# Patient Record
Sex: Female | Born: 1975 | Race: White | Hispanic: No | Marital: Married | Smoking: Never smoker
Health system: Southern US, Community
[De-identification: ages and names within clinical notes are randomized; demographics above are authoritative.]

## PROBLEM LIST (undated history)

## (undated) DIAGNOSIS — F419 Anxiety disorder, unspecified: Secondary | ICD-10-CM

## (undated) HISTORY — PX: WISDOM TOOTH EXTRACTION: SHX21

## (undated) HISTORY — PX: TONSILLECTOMY: SUR1361

---

## 2015-12-09 ENCOUNTER — Encounter (HOSPITAL_COMMUNITY): Payer: Self-pay | Admitting: *Deleted

## 2016-01-28 ENCOUNTER — Encounter (HOSPITAL_COMMUNITY): Payer: Self-pay

## 2016-01-28 ENCOUNTER — Encounter (HOSPITAL_COMMUNITY)
Admission: RE | Disposition: A | Discharge status: Home / Self Care | Payer: Self-pay | Source: Ambulatory Visit | Attending: Obstetrics and Gynecology

## 2016-01-28 ENCOUNTER — Ambulatory Visit (HOSPITAL_COMMUNITY): Payer: BLUE CROSS/BLUE SHIELD | Admitting: Anesthesiology

## 2016-01-28 ENCOUNTER — Ambulatory Visit (HOSPITAL_COMMUNITY)
Admission: RE | Admit: 2016-01-28 | Discharge: 2016-01-28 | Disposition: A | Discharge status: Home / Self Care | Payer: BLUE CROSS/BLUE SHIELD | Source: Ambulatory Visit | Attending: Obstetrics and Gynecology | Admitting: Obstetrics and Gynecology

## 2016-01-28 ENCOUNTER — Other Ambulatory Visit: Payer: Self-pay | Admitting: Obstetrics and Gynecology

## 2016-01-28 DIAGNOSIS — N840 Polyp of corpus uteri: Secondary | ICD-10-CM | POA: Insufficient documentation

## 2016-01-28 DIAGNOSIS — E669 Obesity, unspecified: Secondary | ICD-10-CM | POA: Insufficient documentation

## 2016-01-28 DIAGNOSIS — N920 Excessive and frequent menstruation with regular cycle: Secondary | ICD-10-CM | POA: Diagnosis not present

## 2016-01-28 DIAGNOSIS — Z6832 Body mass index (BMI) 32.0-32.9, adult: Secondary | ICD-10-CM | POA: Insufficient documentation

## 2016-01-28 HISTORY — PX: DILITATION & CURRETTAGE/HYSTROSCOPY WITH NOVASURE ABLATION: SHX5568

## 2016-01-28 HISTORY — DX: Anxiety disorder, unspecified: F41.9

## 2016-01-28 LAB — TYPE AND SCREEN
ABO/RH(D): A NEG
Antibody Screen: NEGATIVE

## 2016-01-28 LAB — CBC
HCT: 39 % (ref 36.0–46.0)
HEMOGLOBIN: 13.9 g/dL (ref 12.0–15.0)
MCH: 32.6 pg (ref 26.0–34.0)
MCHC: 35.6 g/dL (ref 30.0–36.0)
MCV: 91.3 fL (ref 78.0–100.0)
PLATELETS: 234 10*3/uL (ref 150–400)
RBC: 4.27 MIL/uL (ref 3.87–5.11)
RDW: 12.5 % (ref 11.5–15.5)
WBC: 5.4 10*3/uL (ref 4.0–10.5)

## 2016-01-28 LAB — PREGNANCY, URINE: PREG TEST UR: NEGATIVE

## 2016-01-28 LAB — ABO/RH: ABO/RH(D): A NEG

## 2016-01-28 SURGERY — DILATATION & CURETTAGE/HYSTEROSCOPY WITH NOVASURE ABLATION
Anesthesia: General

## 2016-01-28 MED ORDER — LACTATED RINGERS IV SOLN
INTRAVENOUS | Status: DC
  Administered 2016-01-28 (×2): via INTRAVENOUS

## 2016-01-28 MED ORDER — IBUPROFEN 800 MG PO TABS: 800.0000 mg | ORAL_TABLET | Freq: Three times a day (TID) | ORAL | 0 refills | Status: AC | PRN

## 2016-01-28 MED ORDER — LACTATED RINGERS IV SOLN: INTRAVENOUS | Status: DC

## 2016-01-28 MED ORDER — SCOPOLAMINE 1 MG/3DAYS TD PT72
MEDICATED_PATCH | TRANSDERMAL | Status: AC
  Administered 2016-01-28: 1.5 mg via TRANSDERMAL
  Filled 2016-01-28: qty 1

## 2016-01-28 MED ORDER — LACTATED RINGERS IR SOLN
Status: DC | PRN
  Administered 2016-01-28: 3000 mL

## 2016-01-28 MED ORDER — DEXAMETHASONE SODIUM PHOSPHATE 10 MG/ML IJ SOLN
INTRAMUSCULAR | Status: DC | PRN
  Administered 2016-01-28: 4 mg via INTRAVENOUS

## 2016-01-28 MED ORDER — LIDOCAINE HCL (CARDIAC) 20 MG/ML IV SOLN
INTRAVENOUS | Status: DC | PRN
  Administered 2016-01-28: 60 mg via INTRAVENOUS

## 2016-01-28 MED ORDER — LIDOCAINE HCL 1 % IJ SOLN
INTRAMUSCULAR | Status: AC
  Filled 2016-01-28: qty 20

## 2016-01-28 MED ORDER — LIDOCAINE HCL 1 % IJ SOLN
INTRAMUSCULAR | Status: DC | PRN
  Administered 2016-01-28: 10 mL

## 2016-01-28 MED ORDER — MIDAZOLAM HCL 2 MG/2ML IJ SOLN
INTRAMUSCULAR | Status: DC | PRN
  Administered 2016-01-28: 2 mg via INTRAVENOUS

## 2016-01-28 MED ORDER — FENTANYL CITRATE (PF) 100 MCG/2ML IJ SOLN
INTRAMUSCULAR | Status: DC | PRN
  Administered 2016-01-28: 100 ug via INTRAVENOUS

## 2016-01-28 MED ORDER — ONDANSETRON HCL 4 MG/2ML IJ SOLN
INTRAMUSCULAR | Status: DC | PRN
  Administered 2016-01-28: 4 mg via INTRAVENOUS

## 2016-01-28 MED ORDER — SCOPOLAMINE 1 MG/3DAYS TD PT72
1.0000 | MEDICATED_PATCH | Freq: Once | TRANSDERMAL | Status: DC
  Administered 2016-01-28: 1.5 mg via TRANSDERMAL

## 2016-01-28 MED ORDER — GLYCINE 1.5 % IR SOLN: Status: DC | PRN

## 2016-01-28 MED ORDER — ONDANSETRON 8 MG PO TBDP: 8.0000 mg | ORAL_TABLET | Freq: Three times a day (TID) | ORAL | 0 refills | Status: AC | PRN

## 2016-01-28 MED ORDER — ONDANSETRON HCL 4 MG/2ML IJ SOLN: 4.0000 mg | Freq: Once | INTRAMUSCULAR | Status: DC | PRN

## 2016-01-28 MED ORDER — HYDROCODONE-ACETAMINOPHEN 5-325 MG PO TABS: 1.0000 | ORAL_TABLET | Freq: Four times a day (QID) | ORAL | 0 refills | Status: AC | PRN

## 2016-01-28 MED ORDER — FENTANYL CITRATE (PF) 100 MCG/2ML IJ SOLN
INTRAMUSCULAR | Status: AC
Start: 2016-01-28 — End: 2016-01-28
  Filled 2016-01-28: qty 2

## 2016-01-28 MED ORDER — GLYCOPYRROLATE 0.2 MG/ML IJ SOLN
INTRAMUSCULAR | Status: DC | PRN
  Administered 2016-01-28: 0.2 mg via INTRAVENOUS

## 2016-01-28 MED ORDER — MIDAZOLAM HCL 2 MG/2ML IJ SOLN
INTRAMUSCULAR | Status: AC
  Filled 2016-01-28: qty 2

## 2016-01-28 MED ORDER — PROPOFOL 10 MG/ML IV BOLUS
INTRAVENOUS | Status: DC | PRN
  Administered 2016-01-28: 200 mg via INTRAVENOUS

## 2016-01-28 MED ORDER — KETOROLAC TROMETHAMINE 30 MG/ML IJ SOLN
INTRAMUSCULAR | Status: DC | PRN
  Administered 2016-01-28: 30 mg via INTRAVENOUS

## 2016-01-28 MED ORDER — FENTANYL CITRATE (PF) 100 MCG/2ML IJ SOLN: 25.0000 ug | INTRAMUSCULAR | Status: DC | PRN

## 2016-01-28 SURGICAL SUPPLY — 18 items
ABLATOR ENDOMETRIAL BIPOLAR (ABLATOR) ×3 IMPLANT
CANISTER SUCT 3000ML (MISCELLANEOUS) ×3 IMPLANT
CATH ROBINSON RED A/P 16FR (CATHETERS) ×3 IMPLANT
CLOTH BEACON ORANGE TIMEOUT ST (SAFETY) ×3 IMPLANT
CONTAINER PREFILL 10% NBF 60ML (FORM) ×6 IMPLANT
DILATOR CANAL MILEX (MISCELLANEOUS) IMPLANT
ELECT REM PT RETURN 9FT ADLT (ELECTROSURGICAL)
ELECTRODE REM PT RTRN 9FT ADLT (ELECTROSURGICAL) IMPLANT
GLOVE BIO SURGEON STRL SZ7 (GLOVE) ×3 IMPLANT
GLOVE BIOGEL PI IND STRL 7.0 (GLOVE) ×1 IMPLANT
GLOVE BIOGEL PI INDICATOR 7.0 (GLOVE) ×2
GOWN STRL REUS W/TWL LRG LVL3 (GOWN DISPOSABLE) ×9 IMPLANT
PACK VAGINAL MINOR WOMEN LF (CUSTOM PROCEDURE TRAY) ×3 IMPLANT
PAD OB MATERNITY 4.3X12.25 (PERSONAL CARE ITEMS) ×3 IMPLANT
TOWEL OR 17X24 6PK STRL BLUE (TOWEL DISPOSABLE) ×6 IMPLANT
TUBING AQUILEX INFLOW (TUBING) ×3 IMPLANT
TUBING AQUILEX OUTFLOW (TUBING) ×3 IMPLANT
WATER STERILE IRR 1000ML POUR (IV SOLUTION) ×3 IMPLANT

## 2016-01-28 NOTE — Op Note (Signed)
Pre-Operative Diagnosis: 1) menorrhagia Postoperative Diagnosis: 1) menorrhagia Procedure: Hysteroscopy, dilation and curettage, novasure endometrial ablation Surgeon: Dr. Waynard ReedsKendra Anjelita Sheahan Assistant: None Operative Findings: Retroverted uterus, 8 week size. Uterus sounded to 9 cm. Cavity width 2.6 cm, cervical length 4.5 cm. Ablation power 67 watts. Ablation time 2:00. Intrauterine cavity with multiple endometrial polyps noted on hysteroscopy. Bilateral tubal ostia noted following curettage. Following the endometrial ablation procedure a repeat hysteroscopy was performed. Adequate endometrial ablation was noted. Fluid deficit 500 cc Specimen: Endometrial curettings EBL: minimal  Ms. Tracey Boyer Is a 40 year old who presents for definitive surgical management for menorrhagia. Please see the patient's history and physical for complete details of the history. Management options were discussed with the patient. R/B/A reviewed. Following appropriate informed consent was taken to the operating room. The patient was appropriately identified during a time out procedure. General anesthesia was administered and the patient was placed in the dorsal lithotomy position. The patient was prepped and draped in the normal sterile fashion. A speculum was placed into the vagina, a single-tooth tenaculum was placed on the anterior lip of the cervix, and 10 cc of 1% lidocaine was administered in a paracervical fashion. The cervix was serially dilated with Hank dilators. The uterus sounded to 9cm cm. hysteroscopy was performed for the above findings. The hysteroscope was removed and the NovaSure endometrial ablation device was deployed. The cavity assessment was performed and passed successfully on the first try. The ablation was initiated and the procedure was performed for 2 minutes. The NovaSure device was then removed and repeat hysteroscopy was performed for the above findings. The hysteroscope was removed, and the single-tooth  tenaculum was also removed from the anterior lip of the cervix. The tenaculum site was noted to be hemostatic. The speculum was removed from the vagina and this completed the surgical procedure. The patient was awakened in the operating room and transferred to the PACU in stable condition following the procedure. All sponge, lap, needle counts were correct

## 2016-01-28 NOTE — Anesthesia Procedure Notes (Signed)
Procedure Name: LMA Insertion Date/Time: 01/28/2016 12:02 PM Performed by: Shanon PayorGREGORY, Sharmon Cheramie M Pre-anesthesia Checklist: Patient identified, Emergency Drugs available, Suction available, Timeout performed and Patient being monitored Patient Re-evaluated:Patient Re-evaluated prior to inductionOxygen Delivery Method: Circle system utilized Preoxygenation: Pre-oxygenation with 100% oxygen Intubation Type: IV induction LMA: LMA inserted LMA Size: 4.0 Number of attempts: 1 Placement Confirmation: positive ETCO2 and breath sounds checked- equal and bilateral Tube secured with: Tape Dental Injury: Teeth and Oropharynx as per pre-operative assessment

## 2016-01-28 NOTE — Anesthesia Preprocedure Evaluation (Signed)
Anesthesia Evaluation  Patient identified by MRN, date of birth, ID band Patient awake    Reviewed: Allergy & Precautions, H&P , NPO status , Patient's Chart, lab work & pertinent test results  History of Anesthesia Complications Negative for: history of anesthetic complications  Airway Mallampati: II  TM Distance: >3 FB Neck ROM: full    Dental no notable dental hx.    Pulmonary neg pulmonary ROS,    Pulmonary exam normal breath sounds clear to auscultation       Cardiovascular negative cardio ROS Normal cardiovascular exam Rhythm:regular Rate:Normal     Neuro/Psych Anxiety negative neurological ROS     GI/Hepatic negative GI ROS, Neg liver ROS,   Endo/Other  obesity  Renal/GU negative Renal ROS     Musculoskeletal   Abdominal   Peds  Hematology negative hematology ROS (+)   Anesthesia Other Findings   Reproductive/Obstetrics negative OB ROS                             Anesthesia Physical Anesthesia Plan  ASA: II  Anesthesia Plan: General   Post-op Pain Management:    Induction: Intravenous  Airway Management Planned: LMA  Additional Equipment:   Intra-op Plan:   Post-operative Plan:   Informed Consent: I have reviewed the patients History and Physical, chart, labs and discussed the procedure including the risks, benefits and alternatives for the proposed anesthesia with the patient or authorized representative who has indicated his/her understanding and acceptance.   Dental Advisory Given  Plan Discussed with: Anesthesiologist, CRNA and Surgeon  Anesthesia Plan Comments:         Anesthesia Quick Evaluation

## 2016-01-28 NOTE — Brief Op Note (Signed)
01/28/2016  12:47 PM  PATIENT:  Tracey CulverStacy Boyer  40 y.o. female  PRE-OPERATIVE DIAGNOSIS:  MENORRHAGIA  POST-OPERATIVE DIAGNOSIS:  * No post-op diagnosis entered *  PROCEDURE:  Procedure(s): DILATATION & CURETTAGE/HYSTEROSCOPY WITH NOVASURE ABLATION (N/A)  SURGEON:  Surgeon(s) and Role:    * Waynard ReedsKendra Vedansh Kerstetter, MD - Primary  PHYSICIAN ASSISTANT:   ASSISTANTS: none   ANESTHESIA:   general  EBL:  Total I/O In: 1000 [I.V.:1000] Out: 60 [Urine:50; Blood:10]  BLOOD ADMINISTERED:none  DRAINS: none   LOCAL MEDICATIONS USED:  LIDOCAINE   SPECIMEN:  Source of Specimen:  endometrial curettings  DISPOSITION OF SPECIMEN:  PATHOLOGY  COUNTS:  YES  TOURNIQUET:  * No tourniquets in log *  DICTATION: .Dragon Dictation  PLAN OF CARE: Discharge to home after PACU  PATIENT DISPOSITION:  PACU - hemodynamically stable.   Delay start of Pharmacological VTE agent (>24hrs) due to surgical blood loss or risk of bleeding: yes

## 2016-01-28 NOTE — Discharge Instructions (Addendum)
DISCHARGE INSTRUCTIONS: HYSTEROSCOPY / ENDOMETRIAL ABLATION The following instructions have been prepared to help you care for yourself upon your return home.  May Remove Scop patch on or before: Monday September 25,2017  May take Ibuprofen after: 6:30 pm today  May take stool softner while taking narcotic pain medication to prevent constipation.  Drink plenty of water.  Personal hygiene:  Use sanitary pads for vaginal drainage, not tampons.  Shower the day after your procedure.  NO tub baths, pools or Jacuzzis for 2-3 weeks.  Wipe front to back after using the bathroom.  Activity and limitations:  Do NOT drive or operate any equipment for 24 hours. The effects of anesthesia are still present and drowsiness may result.  Do NOT rest in bed all day.  Walking is encouraged.  Walk up and down stairs slowly.  You may resume your normal activity in one to two days or as indicated by your physician. Sexual activity: NO intercourse for at least 2 weeks after the procedure, or as indicated by your Doctor.  Diet: Eat a light meal as desired this evening. You may resume your usual diet tomorrow.  Return to Work: You may resume your work activities in one to two days or as indicated by Therapist, sportsyour Doctor.  What to expect after your surgery: Expect to have vaginal bleeding/discharge for 2-3 days and spotting for up to 10 days. It is not unusual to have soreness for up to 1-2 weeks. You may have a slight burning sensation when you urinate for the first day. Mild cramps may continue for a couple of days. You may have a regular period in 2-6 weeks.  Call your doctor for any of the following:  Excessive vaginal bleeding or clotting, saturating and changing one pad every hour.  Inability to urinate 6 hours after discharge from hospital.  Pain not relieved by pain medication.  Fever of 100.4 F or greater.  Unusual vaginal discharge or odor.  Return to office _________________Call for  an appointment ___________________ Patients signature: ______________________ Nurses signature ________________________  Post Anesthesia Care Unit [phone] Post Anesthesia Home Care Instructions  Activity: Get plenty of rest for the remainder of the day. A responsible adult should stay with you for 24 hours following the procedure.  For the next 24 hours, DO NOT: -Drive a car -Advertising copywriterperate machinery -Drink alcoholic beverages -Take any medication unless instructed by your physician -Make any legal decisions or sign important papers.  Meals: Start with liquid foods such as gelatin or soup. Progress to regular foods as tolerated. Avoid greasy, spicy, heavy foods. If nausea and/or vomiting occur, drink only clear liquids until the nausea and/or vomiting subsides. Call your physician if vomiting continues.  Special Instructions/Symptoms: Your throat may feel dry or sore from the anesthesia or the breathing tube placed in your throat during surgery. If this causes discomfort, gargle with warm salt water. The discomfort should disappear within 24 hours.  If you had a scopolamine patch placed behind your ear for the management of post- operative nausea and/or vomiting:  1. The medication in the patch is effective for 72 hours, after which it should be removed.  Wrap patch in a tissue and discard in the trash. Wash hands thoroughly with soap and water. 2. You may remove the patch earlier than 72 hours if you experience unpleasant side effects which may include dry mouth, dizziness or visual disturbances. 3. Avoid touching the patch. Wash your hands with soap and water after contact with the patch.

## 2016-01-28 NOTE — Transfer of Care (Signed)
Immediate Anesthesia Transfer of Care Note  Patient: Montel CulverStacy Helfand  Procedure(s) Performed: Procedure(s): DILATATION & CURETTAGE/HYSTEROSCOPY WITH NOVASURE ABLATION (N/A)  Patient Location: PACU  Anesthesia Type:General  Level of Consciousness: awake, alert  and oriented  Airway & Oxygen Therapy: Patient Spontanous Breathing and Patient connected to nasal cannula oxygen  Post-op Assessment: Report given to RN and Post -op Vital signs reviewed and stable  Post vital signs: Reviewed and stable  Last Vitals:  Vitals:   01/28/16 1043  BP: 122/80  Pulse: 61  Resp: 16  Temp: 36.8 C    Last Pain:  Vitals:   01/28/16 1043  TempSrc: Oral      Patients Stated Pain Goal: 3 (01/28/16 1043)  Complications: No apparent anesthesia complications

## 2016-01-28 NOTE — Anesthesia Postprocedure Evaluation (Signed)
Anesthesia Post Note  Patient: Nikeya Pamintuan  Procedure(s) Performed: Procedure(s) (LRB): DILATATION & CURETTAGE/HYSTEROSCOPY WITH NOVASURE ABLATION (N/A)  Patient location during evaluation: PACU Anesthesia Type: General Level of consciousness: awake and alert Pain management: pain level controlled Vital Signs Assessment: post-procedure vital signs reviewed and stable Respiratory status: spontaneous breathing, nonlabored ventilation, respiratory function stable and patient connected to nasal cannula oxygen Cardiovascular status: blood pressure returned to baseline and stable Postop Assessment: no signs of nausea or vomiting Anesthetic complications: no     Last Vitals:  Vitals:   01/28/16 1043 01/28/16 1256  BP: 122/80 117/69  Pulse: 61 86  Resp: 16 14  Temp: 36.8 C 36.8 C    Last Pain:  Vitals:   01/28/16 1256  TempSrc:   PainSc: 1    Pain Goal: Patients Stated Pain Goal: 3 (01/28/16 1256)               Reino KentJudd, Coulter Oldaker J

## 2016-01-28 NOTE — H&P (Signed)
Tracey CulverStacy Boyer is an 40 y.o. female.   Patient presents for hysteroscopy, dilation and curettage, and endometrial ablation. The patient has a history of heavy menstrual periods and has been poorly tolerant of medical management in the past. Office ultrasound revealed an anatomically normal uterus and ovaries. Endometrial biopsy was benign. R/B./A reviewed with the patient and she wishes to proceed.   Patient's last menstrual period was 11/20/2015 (approximate).    Past Medical History:  Diagnosis Date  . Anxiety   . SVD (spontaneous vaginal delivery)    x 3    Past Surgical History:  Procedure Laterality Date  . CESAREAN SECTION     x 1  . TONSILLECTOMY    . WISDOM TOOTH EXTRACTION      No family history on file.  Social History:  reports that she has never smoked. She has never used smokeless tobacco. She reports that she drinks about 3.0 oz of alcohol per week . She reports that she does not use drugs.  Allergies: No Known Allergies   (Not in a hospital admission)  ROS  Last menstrual period 11/20/2015. Physical Exam   AOX3, NAD ABD SOFT, NT Normal work of breathing  No results found for this or any previous visit (from the past 24 hour(s)).  No results found.  Assessment/Plan: 1) Admit 2) consent 3) proceed with above procedure  Tracey Boyer H. 01/28/2016, 8:39 AM

## 2016-01-30 ENCOUNTER — Encounter (HOSPITAL_COMMUNITY): Payer: Self-pay | Admitting: Obstetrics and Gynecology

## 2016-02-29 DIAGNOSIS — E559 Vitamin D deficiency, unspecified: Secondary | ICD-10-CM | POA: Diagnosis not present

## 2016-02-29 DIAGNOSIS — Z0000 Encounter for general adult medical examination without abnormal findings: Secondary | ICD-10-CM | POA: Diagnosis not present

## 2016-02-29 DIAGNOSIS — F419 Anxiety disorder, unspecified: Secondary | ICD-10-CM | POA: Diagnosis not present

## 2016-02-29 DIAGNOSIS — F411 Generalized anxiety disorder: Secondary | ICD-10-CM | POA: Diagnosis not present

## 2016-02-29 DIAGNOSIS — Z13220 Encounter for screening for lipoid disorders: Secondary | ICD-10-CM | POA: Diagnosis not present

## 2016-10-30 DIAGNOSIS — E669 Obesity, unspecified: Secondary | ICD-10-CM | POA: Diagnosis not present

## 2016-10-30 DIAGNOSIS — F419 Anxiety disorder, unspecified: Secondary | ICD-10-CM | POA: Diagnosis not present

## 2016-10-30 DIAGNOSIS — E559 Vitamin D deficiency, unspecified: Secondary | ICD-10-CM | POA: Diagnosis not present

## 2016-10-30 DIAGNOSIS — Z6834 Body mass index (BMI) 34.0-34.9, adult: Secondary | ICD-10-CM | POA: Diagnosis not present

## 2017-01-31 DIAGNOSIS — Z713 Dietary counseling and surveillance: Secondary | ICD-10-CM | POA: Diagnosis not present

## 2017-02-27 DIAGNOSIS — Z01419 Encounter for gynecological examination (general) (routine) without abnormal findings: Secondary | ICD-10-CM | POA: Diagnosis not present

## 2017-02-27 DIAGNOSIS — Z6833 Body mass index (BMI) 33.0-33.9, adult: Secondary | ICD-10-CM | POA: Diagnosis not present

## 2017-02-27 DIAGNOSIS — Z124 Encounter for screening for malignant neoplasm of cervix: Secondary | ICD-10-CM | POA: Diagnosis not present

## 2017-03-14 DIAGNOSIS — Z713 Dietary counseling and surveillance: Secondary | ICD-10-CM | POA: Diagnosis not present

## 2017-04-13 DIAGNOSIS — Z713 Dietary counseling and surveillance: Secondary | ICD-10-CM | POA: Diagnosis not present

## 2017-05-11 DIAGNOSIS — E669 Obesity, unspecified: Secondary | ICD-10-CM | POA: Diagnosis not present

## 2017-05-11 DIAGNOSIS — F419 Anxiety disorder, unspecified: Secondary | ICD-10-CM | POA: Diagnosis not present

## 2017-06-01 DIAGNOSIS — Z6833 Body mass index (BMI) 33.0-33.9, adult: Secondary | ICD-10-CM | POA: Diagnosis not present

## 2017-06-01 DIAGNOSIS — Z1231 Encounter for screening mammogram for malignant neoplasm of breast: Secondary | ICD-10-CM | POA: Diagnosis not present

## 2017-06-04 ENCOUNTER — Other Ambulatory Visit: Payer: Self-pay | Admitting: Obstetrics and Gynecology

## 2017-06-04 DIAGNOSIS — R928 Other abnormal and inconclusive findings on diagnostic imaging of breast: Secondary | ICD-10-CM

## 2017-06-08 ENCOUNTER — Ambulatory Visit
Admission: RE | Admit: 2017-06-08 | Discharge: 2017-06-08 | Disposition: A | Discharge status: Home / Self Care | Payer: BLUE CROSS/BLUE SHIELD | Source: Ambulatory Visit | Attending: Obstetrics and Gynecology | Admitting: Obstetrics and Gynecology

## 2017-06-08 DIAGNOSIS — R922 Inconclusive mammogram: Secondary | ICD-10-CM | POA: Diagnosis not present

## 2017-06-08 DIAGNOSIS — R928 Other abnormal and inconclusive findings on diagnostic imaging of breast: Secondary | ICD-10-CM

## 2017-06-08 DIAGNOSIS — N6310 Unspecified lump in the right breast, unspecified quadrant: Secondary | ICD-10-CM | POA: Diagnosis not present

## 2017-06-08 DIAGNOSIS — N6320 Unspecified lump in the left breast, unspecified quadrant: Secondary | ICD-10-CM | POA: Diagnosis not present

## 2017-06-20 DIAGNOSIS — Z713 Dietary counseling and surveillance: Secondary | ICD-10-CM | POA: Diagnosis not present

## 2017-12-24 DIAGNOSIS — F419 Anxiety disorder, unspecified: Secondary | ICD-10-CM | POA: Diagnosis not present

## 2017-12-24 DIAGNOSIS — F5101 Primary insomnia: Secondary | ICD-10-CM | POA: Diagnosis not present

## 2018-03-18 DIAGNOSIS — J029 Acute pharyngitis, unspecified: Secondary | ICD-10-CM | POA: Diagnosis not present

## 2018-04-19 DIAGNOSIS — R05 Cough: Secondary | ICD-10-CM | POA: Diagnosis not present

## 2018-04-19 DIAGNOSIS — J209 Acute bronchitis, unspecified: Secondary | ICD-10-CM | POA: Diagnosis not present

## 2018-04-19 DIAGNOSIS — J40 Bronchitis, not specified as acute or chronic: Secondary | ICD-10-CM | POA: Diagnosis not present

## 2018-06-04 DIAGNOSIS — Z1231 Encounter for screening mammogram for malignant neoplasm of breast: Secondary | ICD-10-CM | POA: Diagnosis not present

## 2018-06-04 DIAGNOSIS — Z6833 Body mass index (BMI) 33.0-33.9, adult: Secondary | ICD-10-CM | POA: Diagnosis not present

## 2018-11-12 ENCOUNTER — Ambulatory Visit (INDEPENDENT_AMBULATORY_CARE_PROVIDER_SITE_OTHER): Payer: BC Managed Care – PPO

## 2018-11-12 ENCOUNTER — Ambulatory Visit (INDEPENDENT_AMBULATORY_CARE_PROVIDER_SITE_OTHER): Payer: BC Managed Care – PPO | Admitting: Orthopaedic Surgery

## 2018-11-12 ENCOUNTER — Other Ambulatory Visit: Payer: Self-pay

## 2018-11-12 DIAGNOSIS — M25562 Pain in left knee: Secondary | ICD-10-CM

## 2018-11-12 MED ORDER — BUPIVACAINE HCL 0.5 % IJ SOLN
2.0000 mL | INTRAMUSCULAR | Status: AC | PRN
  Administered 2018-11-12: 11:00:00 2 mL via INTRA_ARTICULAR

## 2018-11-12 MED ORDER — LIDOCAINE HCL 1 % IJ SOLN
2.0000 mL | INTRAMUSCULAR | Status: AC | PRN
  Administered 2018-11-12: 11:00:00 2 mL

## 2018-11-12 MED ORDER — METHYLPREDNISOLONE ACETATE 40 MG/ML IJ SUSP
40.0000 mg | INTRAMUSCULAR | Status: AC | PRN
  Administered 2018-11-12: 40 mg via INTRA_ARTICULAR

## 2018-11-12 NOTE — Progress Notes (Signed)
Office Visit Note   Patient: Tracey Boyer           Date of Birth: 26-Apr-1976           MRN: [id number] Visit Date: 11/12/2018              Requested by: Kathyrn Lass, [place],  [place] [address] PCP: Kathyrn Lass, MD   Assessment & Plan: Visit Diagnoses:  1. Left knee pain, unspecified chronicity     Plan: Impression is lateral left knee pain.  Questionable lateral meniscus degenerative tear versus stress fracture versus popliteus injury.  We discussed multiple treatment options and she agreed to have a cortisone injection today which she tolerated well.  She is to take it easy for the next 2 to 4 weeks.  She works from home fortunately.  She will contact me if she does not get any relief from this injection within the next couple weeks at which point we will likely consider an MRI.  Questions encouraged and answered.  Follow-Up Instructions: Return if symptoms worsen or fail to improve.   Orders:  Orders Placed This Encounter  Procedures  . XR KNEE 3 VIEW LEFT   No orders of the defined types were placed in this encounter.     Procedures: Large Joint Inj: L knee on 11/12/2018 11:12 AM Details: 22 G needle Medications: 2 mL bupivacaine 0.5 %; 2 mL lidocaine 1 %; 40 mg methylPREDNISolone acetate 40 MG/ML Outcome: tolerated well, no immediate complications Patient was prepped and draped in the usual sterile fashion.       Clinical Data: No additional findings.   Subjective: Chief Complaint  Patient presents with  . Left Knee - Pain    Tracey Boyer is a 43 year old female who is the wife of one of my tennis friends who comes in with acute onset of left knee pain since 11/08/2018.  She denies any definite injuries.  She states that she was playing tennis and have progressively worsening pain and then she sat down for about 30 minutes to eat and had trouble getting back up and walking.  She denies any mechanical symptoms.  She states that the knee is very  sore especially with weightbearing.  Initially there was swelling but it seems like the swelling has resolved.  She has been taking some Advil with some temporary relief.  Denies any gout.  She works as a Forensic psychologist for [employer].   Review of Systems  Constitutional: Negative.   HENT: Negative.   Eyes: Negative.   Respiratory: Negative.   Cardiovascular: Negative.   Endocrine: Negative.   Musculoskeletal: Negative.   Neurological: Negative.   Hematological: Negative.   Psychiatric/Behavioral: Negative.   All other systems reviewed and are negative.    Objective: Vital Signs: There were no vitals taken for this visit.  Physical Exam Vitals signs and nursing note reviewed.  Constitutional:      Appearance: She is well-developed.  HENT:     Head: Normocephalic and atraumatic.  Neck:     Musculoskeletal: Neck supple.  Pulmonary:     Effort: Pulmonary effort is normal.  Abdominal:     Palpations: Abdomen is soft.  Skin:    General: Skin is warm.     Capillary Refill: Capillary refill takes less than 2 seconds.  Neurological:     Mental Status: She is alert and oriented to person, place, and time.  Psychiatric:        Behavior: Behavior normal.  Thought Content: Thought content normal.        Judgment: Judgment normal.     Ortho Exam Left knee exam shows a possible trace effusion.  She has lateral joint line tenderness more so in the posterior lateral region.  Dial test is normal.  ACL PCL are stable.  Collaterals are stable.  She has full range of motion.  Patella tracking is normal. Specialty Comments:  No specialty comments available.  Imaging: Xr Knee 3 View Left  Result Date: 11/12/2018 No acute or structural abnormalities    PMFS History: There are no active problems to display for this patient.  Past Medical History:  Diagnosis Date  . Anxiety   . SVD (spontaneous vaginal delivery)    x 3    Family History  Problem Relation Age of Onset  . Breast  cancer Mother [id number]    Past Surgical History:  Procedure Laterality Date  . CESAREAN SECTION     x 1  . DILITATION & CURRETTAGE/HYSTROSCOPY WITH NOVASURE ABLATION N/A 01/28/2016   Procedure: DILATATION & CURETTAGE/HYSTEROSCOPY WITH NOVASURE ABLATION;  Surgeon: Waynard ReedsKendra Ross, MD;  Location: [facility];  Service: Gynecology;  Laterality: N/A;  . TONSILLECTOMY    . WISDOM TOOTH EXTRACTION     Social History   Occupational History  . Not on file  Tobacco Use  . Smoking status: Never Smoker  . Smokeless tobacco: Never Used  Substance and Sexual Activity  . Alcohol use: Yes    Alcohol/week: 5.0 standard drinks    Types: 5 Glasses of wine per week  . Drug use: No  . Sexual activity: Yes    Birth control/protection: Condom

## 2019-02-17 DIAGNOSIS — H6982 Other specified disorders of Eustachian tube, left ear: Secondary | ICD-10-CM | POA: Diagnosis not present

## 2019-06-17 ENCOUNTER — Other Ambulatory Visit: Payer: Self-pay | Admitting: Obstetrics and Gynecology

## 2019-06-17 DIAGNOSIS — M79621 Pain in right upper arm: Secondary | ICD-10-CM

## 2019-07-10 ENCOUNTER — Ambulatory Visit
Admission: RE | Admit: 2019-07-10 | Discharge: 2019-07-10 | Disposition: A | Discharge status: Home / Self Care | Payer: BC Managed Care – PPO | Source: Ambulatory Visit | Attending: Obstetrics and Gynecology | Admitting: Obstetrics and Gynecology

## 2019-07-10 ENCOUNTER — Other Ambulatory Visit: Payer: Self-pay | Admitting: Obstetrics and Gynecology

## 2019-07-10 ENCOUNTER — Other Ambulatory Visit: Payer: Self-pay

## 2019-07-10 DIAGNOSIS — N6001 Solitary cyst of right breast: Secondary | ICD-10-CM | POA: Diagnosis not present

## 2019-07-10 DIAGNOSIS — N644 Mastodynia: Secondary | ICD-10-CM

## 2019-07-10 DIAGNOSIS — R922 Inconclusive mammogram: Secondary | ICD-10-CM | POA: Diagnosis not present

## 2019-07-10 DIAGNOSIS — M79621 Pain in right upper arm: Secondary | ICD-10-CM

## 2020-02-01 IMAGING — MG 2D DIGITAL DIAGNOSTIC BILATERAL MAMMOGRAM WITH CAD AND ADJUNCT T
8 of 12 series · 8 of 28 positions shown · non-contrast
Comparison: Previous exam(s).

CLINICAL DATA: 41-year-old female recalled from screening mammogram
dated 06/01/2017 for bilateral breast masses.

EXAM:
2D DIGITAL DIAGNOSTIC BILATERAL MAMMOGRAM WITH CAD AND ADJUNCT TOMO
ULTRASOUND BILATERAL BREAST

[L CC]
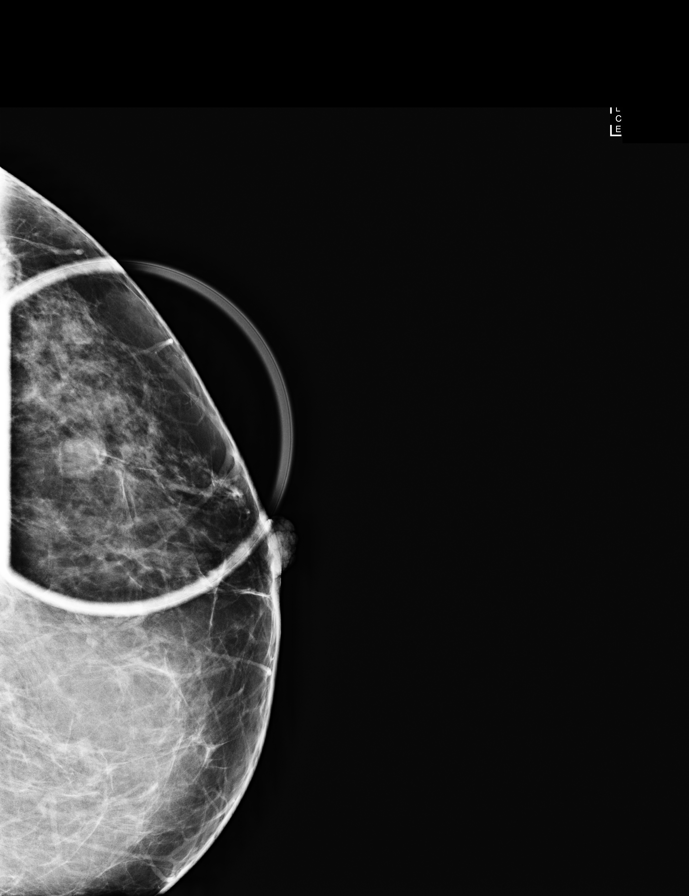

[R CC synth-2D]
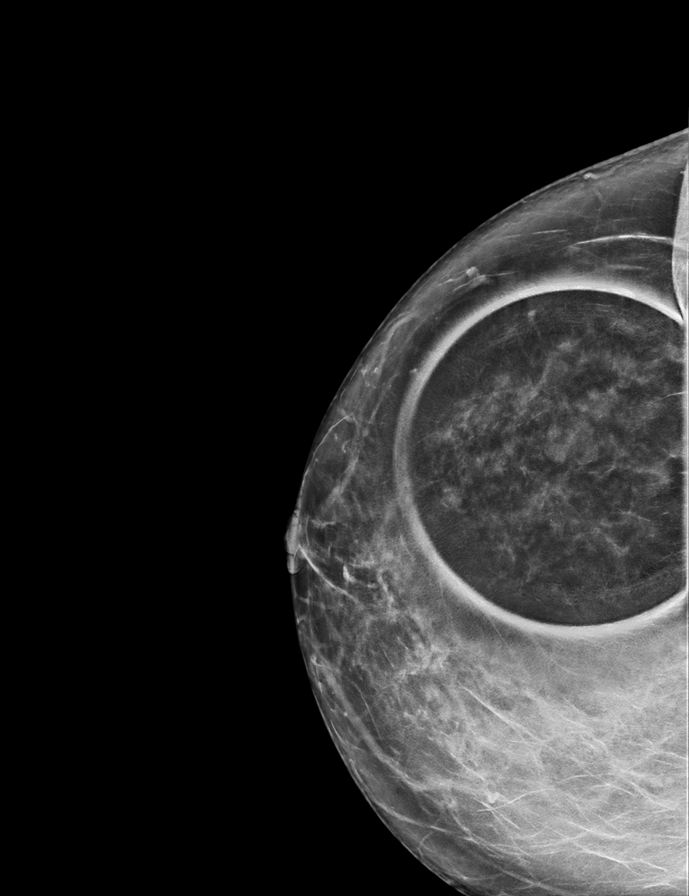

[R MLO]
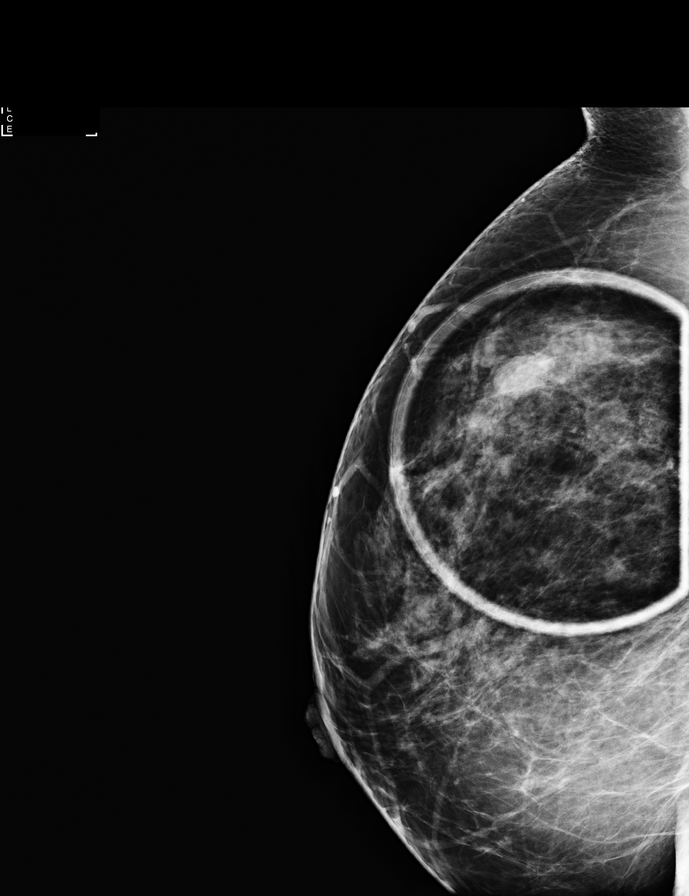

[L MLO synth-2D]
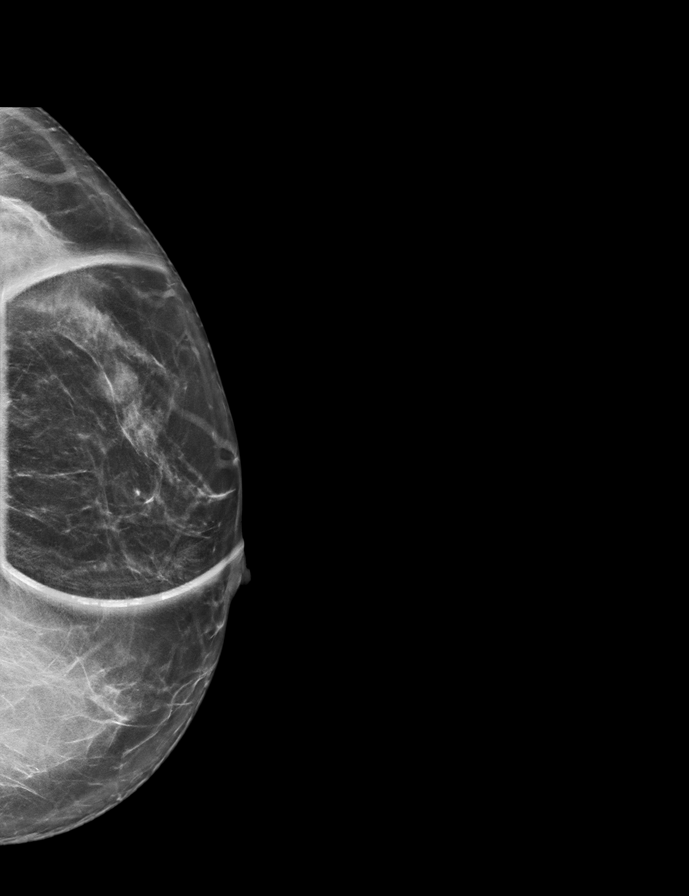

[L CC synth-2D]
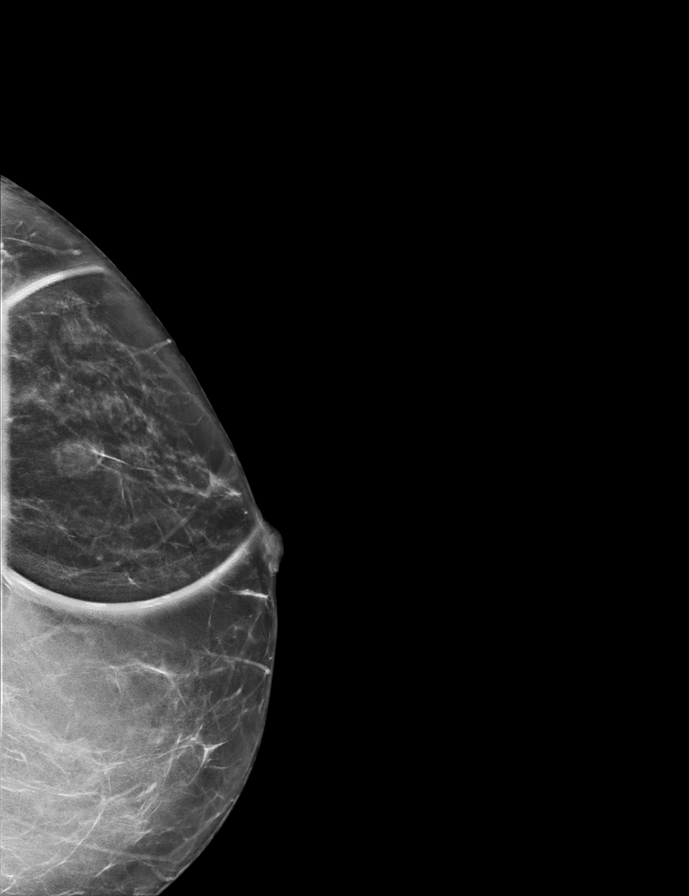

[R MLO synth-2D]
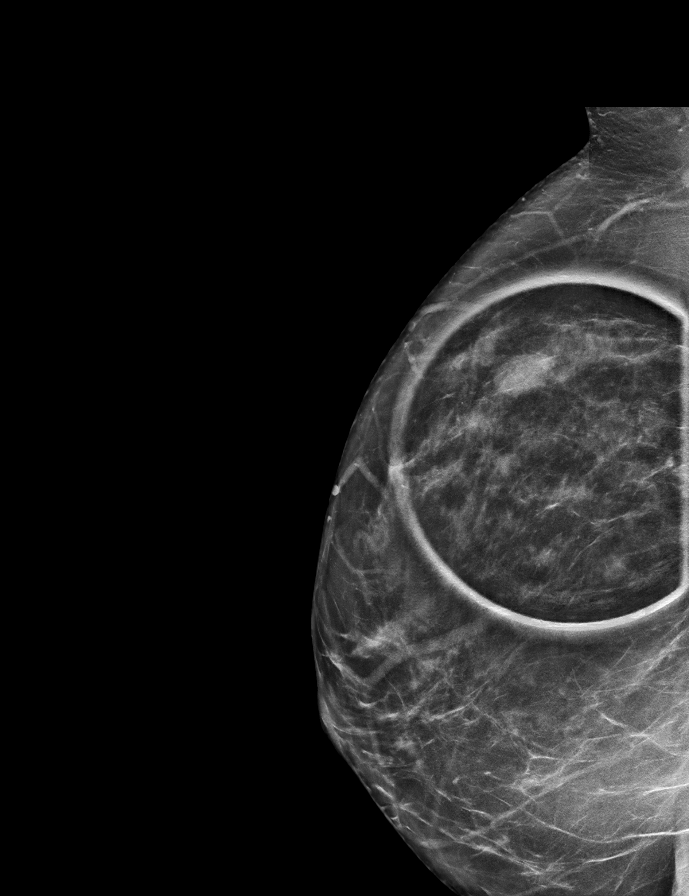

[L MLO]
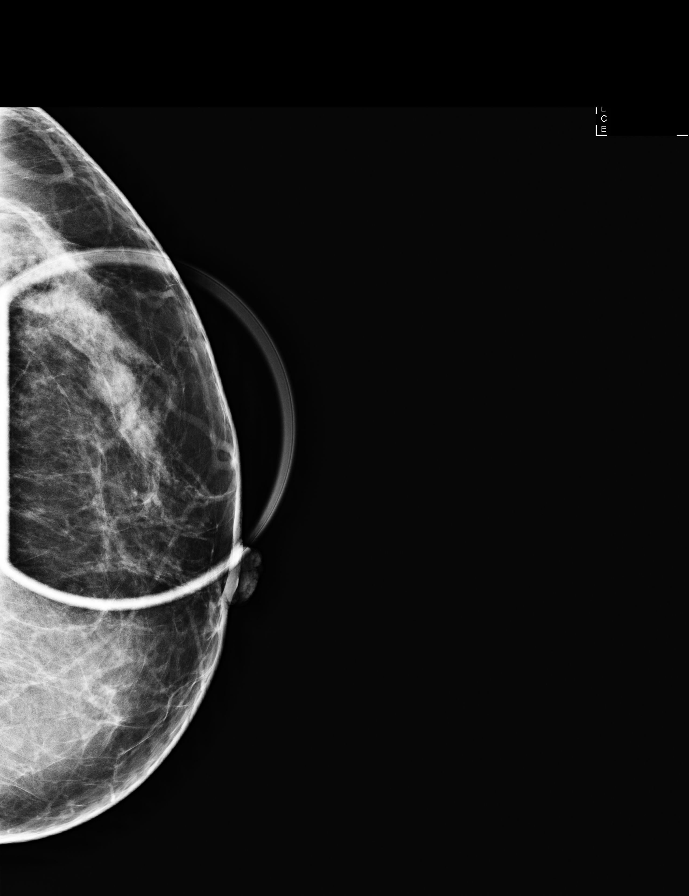

[R CC]
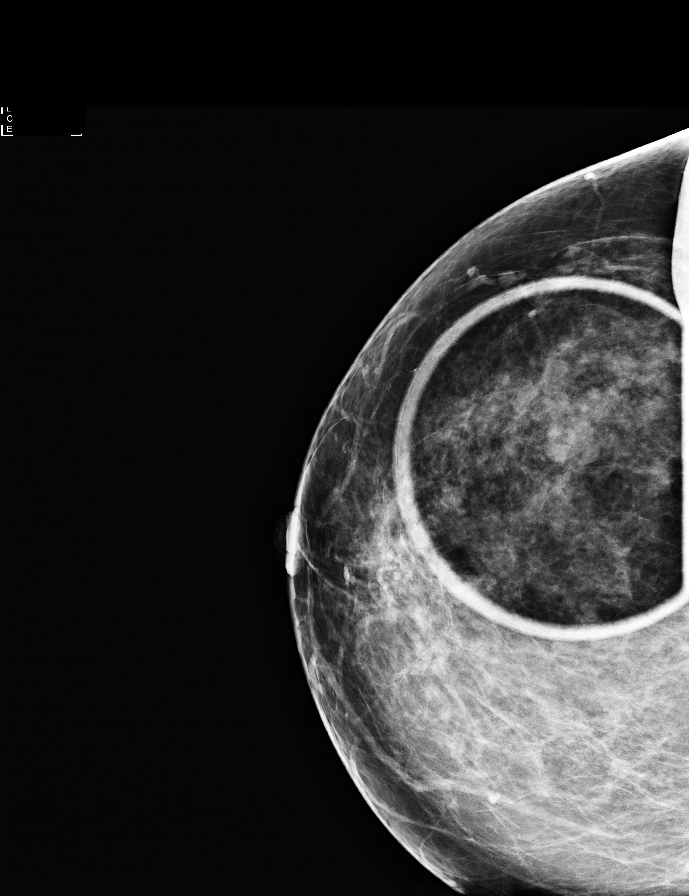

[8 of 28 positions shown; findings below may reference images not displayed]

ACR Breast Density Category c: The breast tissue is heterogeneously
dense, which may obscure small masses.
FINDINGS: Round, circumscribed equal density masses are demonstrated in the
upper outer quadrants of the bilateral breasts, posteriorly on the
right and middle depth on the left. Further evaluation with
ultrasound was performed.

Mammographic images were processed with CAD.

Targeted ultrasound is performed, showing benign appearing simple
cysts within the bilateral breasts corresponding with the
mammographic findings. This includes a 1.1 x 1.0 x 0.6 cm cyst at
the 11 o'clock position 6 cm from the nipple on the right and 0.9 x
0.9 x 0.8 cm cyst at the 2 o'clock position 2 cm from the nipple on
the left. There is no internal vascularity.
IMPRESSION: Benign bilateral simple cysts corresponding with the screening
mammographic findings. No further imaging follow-up required.

RECOMMENDATION:
Screening mammogram in one year.(Code:5F-8-EMP)

I have discussed the findings and recommendations with the patient.
Results were also provided in writing at the conclusion of the
visit. If applicable, a reminder letter will be sent to the patient
regarding the next appointment.

BI-RADS CATEGORY  2: Benign.

## 2021-07-28 ENCOUNTER — Other Ambulatory Visit: Payer: Self-pay | Admitting: Obstetrics and Gynecology

## 2021-07-28 DIAGNOSIS — N6310 Unspecified lump in the right breast, unspecified quadrant: Secondary | ICD-10-CM

## 2021-08-15 ENCOUNTER — Ambulatory Visit
Admission: RE | Admit: 2021-08-15 | Discharge: 2021-08-15 | Disposition: A | Discharge status: Home / Self Care | Payer: BC Managed Care – PPO | Source: Ambulatory Visit | Attending: Obstetrics and Gynecology | Admitting: Obstetrics and Gynecology

## 2021-08-15 ENCOUNTER — Other Ambulatory Visit: Payer: Self-pay | Admitting: Obstetrics and Gynecology

## 2021-08-15 DIAGNOSIS — R921 Mammographic calcification found on diagnostic imaging of breast: Secondary | ICD-10-CM

## 2021-08-15 DIAGNOSIS — N6012 Diffuse cystic mastopathy of left breast: Secondary | ICD-10-CM | POA: Diagnosis not present

## 2021-08-15 DIAGNOSIS — N6310 Unspecified lump in the right breast, unspecified quadrant: Secondary | ICD-10-CM

## 2021-08-15 DIAGNOSIS — N644 Mastodynia: Secondary | ICD-10-CM | POA: Diagnosis not present

## 2021-08-15 DIAGNOSIS — R922 Inconclusive mammogram: Secondary | ICD-10-CM | POA: Diagnosis not present

## 2021-12-13 DIAGNOSIS — Z130 Encounter for screening for diseases of the blood and blood-forming organs and certain disorders involving the immune mechanism: Secondary | ICD-10-CM | POA: Diagnosis not present

## 2021-12-13 DIAGNOSIS — Z01419 Encounter for gynecological examination (general) (routine) without abnormal findings: Secondary | ICD-10-CM | POA: Diagnosis not present

## 2021-12-13 DIAGNOSIS — Z13220 Encounter for screening for lipoid disorders: Secondary | ICD-10-CM | POA: Diagnosis not present

## 2021-12-13 DIAGNOSIS — Z1329 Encounter for screening for other suspected endocrine disorder: Secondary | ICD-10-CM | POA: Diagnosis not present

## 2022-01-12 ENCOUNTER — Other Ambulatory Visit: Payer: Self-pay | Admitting: Obstetrics and Gynecology

## 2022-01-12 ENCOUNTER — Other Ambulatory Visit: Payer: Self-pay

## 2022-01-12 DIAGNOSIS — Z9889 Other specified postprocedural states: Secondary | ICD-10-CM

## 2022-03-08 ENCOUNTER — Ambulatory Visit
Admission: RE | Admit: 2022-03-08 | Discharge: 2022-03-08 | Disposition: A | Discharge status: Home / Self Care | Payer: BC Managed Care – PPO | Source: Ambulatory Visit | Attending: Obstetrics and Gynecology | Admitting: Obstetrics and Gynecology

## 2022-03-08 DIAGNOSIS — R921 Mammographic calcification found on diagnostic imaging of breast: Secondary | ICD-10-CM | POA: Diagnosis not present

## 2022-03-08 DIAGNOSIS — Z9889 Other specified postprocedural states: Secondary | ICD-10-CM

## 2022-03-09 ENCOUNTER — Other Ambulatory Visit: Payer: Self-pay | Admitting: Obstetrics and Gynecology

## 2022-03-09 DIAGNOSIS — R921 Mammographic calcification found on diagnostic imaging of breast: Secondary | ICD-10-CM

## 2022-03-14 ENCOUNTER — Ambulatory Visit
Admission: RE | Admit: 2022-03-14 | Discharge: 2022-03-14 | Disposition: A | Discharge status: Home / Self Care | Payer: BC Managed Care – PPO | Source: Ambulatory Visit | Attending: Obstetrics and Gynecology | Admitting: Obstetrics and Gynecology

## 2022-03-14 ENCOUNTER — Other Ambulatory Visit: Payer: Self-pay | Admitting: Obstetrics and Gynecology

## 2022-03-14 DIAGNOSIS — R921 Mammographic calcification found on diagnostic imaging of breast: Secondary | ICD-10-CM

## 2022-03-14 DIAGNOSIS — N6012 Diffuse cystic mastopathy of left breast: Secondary | ICD-10-CM | POA: Diagnosis not present

## 2022-03-14 HISTORY — PX: BREAST BIOPSY: SHX20

## 2022-07-05 LAB — COLOGUARD: COLOGUARD: NEGATIVE

## 2022-08-31 DIAGNOSIS — M542 Cervicalgia: Secondary | ICD-10-CM | POA: Diagnosis not present

## 2022-09-05 DIAGNOSIS — M542 Cervicalgia: Secondary | ICD-10-CM | POA: Diagnosis not present

## 2022-09-07 DIAGNOSIS — M542 Cervicalgia: Secondary | ICD-10-CM | POA: Diagnosis not present

## 2022-09-12 DIAGNOSIS — M5412 Radiculopathy, cervical region: Secondary | ICD-10-CM | POA: Diagnosis not present

## 2022-09-12 DIAGNOSIS — M47892 Other spondylosis, cervical region: Secondary | ICD-10-CM | POA: Diagnosis not present

## 2022-09-14 DIAGNOSIS — M5412 Radiculopathy, cervical region: Secondary | ICD-10-CM | POA: Diagnosis not present

## 2022-09-14 DIAGNOSIS — M47892 Other spondylosis, cervical region: Secondary | ICD-10-CM | POA: Diagnosis not present

## 2022-09-19 DIAGNOSIS — M47892 Other spondylosis, cervical region: Secondary | ICD-10-CM | POA: Diagnosis not present

## 2022-09-19 DIAGNOSIS — M5412 Radiculopathy, cervical region: Secondary | ICD-10-CM | POA: Diagnosis not present

## 2022-10-05 DIAGNOSIS — M5412 Radiculopathy, cervical region: Secondary | ICD-10-CM | POA: Diagnosis not present

## 2022-10-05 DIAGNOSIS — M47892 Other spondylosis, cervical region: Secondary | ICD-10-CM | POA: Diagnosis not present

## 2022-12-19 DIAGNOSIS — Z01419 Encounter for gynecological examination (general) (routine) without abnormal findings: Secondary | ICD-10-CM | POA: Diagnosis not present

## 2022-12-19 DIAGNOSIS — Z6834 Body mass index (BMI) 34.0-34.9, adult: Secondary | ICD-10-CM | POA: Diagnosis not present

## 2023-01-16 ENCOUNTER — Other Ambulatory Visit: Payer: Self-pay | Admitting: Obstetrics and Gynecology

## 2023-01-16 DIAGNOSIS — Z9189 Other specified personal risk factors, not elsewhere classified: Secondary | ICD-10-CM

## 2023-02-22 ENCOUNTER — Encounter: Payer: Self-pay | Admitting: Obstetrics and Gynecology

## 2023-03-03 ENCOUNTER — Ambulatory Visit
Admission: RE | Admit: 2023-03-03 | Discharge: 2023-03-03 | Disposition: A | Discharge status: Home / Self Care | Payer: BC Managed Care – PPO | Source: Ambulatory Visit | Attending: Obstetrics and Gynecology | Admitting: Obstetrics and Gynecology

## 2023-03-03 DIAGNOSIS — Z9189 Other specified personal risk factors, not elsewhere classified: Secondary | ICD-10-CM

## 2023-03-03 DIAGNOSIS — N6320 Unspecified lump in the left breast, unspecified quadrant: Secondary | ICD-10-CM | POA: Diagnosis not present

## 2023-03-03 MED ORDER — GADOPICLENOL 0.5 MMOL/ML IV SOLN
9.0000 mL | Freq: Once | INTRAVENOUS | Status: AC | PRN
  Administered 2023-03-03: 9 mL via INTRAVENOUS

## 2023-03-05 DIAGNOSIS — F418 Other specified anxiety disorders: Secondary | ICD-10-CM | POA: Diagnosis not present

## 2023-03-05 DIAGNOSIS — Z0000 Encounter for general adult medical examination without abnormal findings: Secondary | ICD-10-CM | POA: Diagnosis not present

## 2023-03-05 DIAGNOSIS — E559 Vitamin D deficiency, unspecified: Secondary | ICD-10-CM | POA: Diagnosis not present

## 2023-03-06 ENCOUNTER — Other Ambulatory Visit: Payer: Self-pay | Admitting: Obstetrics and Gynecology

## 2023-03-06 DIAGNOSIS — R9389 Abnormal findings on diagnostic imaging of other specified body structures: Secondary | ICD-10-CM

## 2023-03-07 ENCOUNTER — Encounter: Payer: Self-pay | Admitting: Speech Pathology

## 2023-03-07 DIAGNOSIS — R9389 Abnormal findings on diagnostic imaging of other specified body structures: Secondary | ICD-10-CM

## 2023-03-08 ENCOUNTER — Other Ambulatory Visit: Payer: Self-pay | Admitting: Obstetrics and Gynecology

## 2023-03-08 ENCOUNTER — Ambulatory Visit
Admission: RE | Admit: 2023-03-08 | Discharge: 2023-03-08 | Disposition: A | Discharge status: Home / Self Care | Payer: BC Managed Care – PPO | Source: Ambulatory Visit | Attending: Obstetrics and Gynecology | Admitting: Obstetrics and Gynecology

## 2023-03-08 DIAGNOSIS — R9389 Abnormal findings on diagnostic imaging of other specified body structures: Secondary | ICD-10-CM

## 2023-03-08 DIAGNOSIS — N6012 Diffuse cystic mastopathy of left breast: Secondary | ICD-10-CM | POA: Diagnosis not present

## 2023-03-08 DIAGNOSIS — N6321 Unspecified lump in the left breast, upper outer quadrant: Secondary | ICD-10-CM | POA: Diagnosis not present

## 2023-03-08 MED ORDER — GADOPICLENOL 0.5 MMOL/ML IV SOLN
9.0000 mL | Freq: Once | INTRAVENOUS | Status: AC | PRN
  Administered 2023-03-08: 9 mL via INTRAVENOUS

## 2023-03-09 LAB — SURGICAL PATHOLOGY

## 2023-03-12 DIAGNOSIS — Z0000 Encounter for general adult medical examination without abnormal findings: Secondary | ICD-10-CM | POA: Diagnosis not present

## 2023-03-12 DIAGNOSIS — Z13220 Encounter for screening for lipoid disorders: Secondary | ICD-10-CM | POA: Diagnosis not present

## 2023-03-12 DIAGNOSIS — E559 Vitamin D deficiency, unspecified: Secondary | ICD-10-CM | POA: Diagnosis not present

## 2024-02-06 ENCOUNTER — Other Ambulatory Visit: Payer: Self-pay | Admitting: Obstetrics and Gynecology

## 2024-02-06 DIAGNOSIS — Z9189 Other specified personal risk factors, not elsewhere classified: Secondary | ICD-10-CM

## 2024-03-06 ENCOUNTER — Other Ambulatory Visit: Payer: Self-pay | Admitting: Obstetrics and Gynecology

## 2024-03-06 DIAGNOSIS — Z1231 Encounter for screening mammogram for malignant neoplasm of breast: Secondary | ICD-10-CM

## 2024-04-01 ENCOUNTER — Ambulatory Visit
Admission: RE | Admit: 2024-04-01 | Discharge: 2024-04-01 | Disposition: A | Source: Ambulatory Visit | Attending: Obstetrics and Gynecology | Admitting: Obstetrics and Gynecology

## 2024-04-01 DIAGNOSIS — Z1231 Encounter for screening mammogram for malignant neoplasm of breast: Secondary | ICD-10-CM

## 2024-04-07 ENCOUNTER — Other Ambulatory Visit: Payer: Self-pay | Admitting: Internal Medicine

## 2024-04-07 DIAGNOSIS — R928 Other abnormal and inconclusive findings on diagnostic imaging of breast: Secondary | ICD-10-CM

## 2024-04-12 ENCOUNTER — Ambulatory Visit
Admission: RE | Admit: 2024-04-12 | Discharge: 2024-04-12 | Disposition: A | Source: Ambulatory Visit | Attending: Internal Medicine | Admitting: Internal Medicine

## 2024-04-12 DIAGNOSIS — R928 Other abnormal and inconclusive findings on diagnostic imaging of breast: Secondary | ICD-10-CM
